# Patient Record
Sex: Male | Born: 1990 | Race: White | Hispanic: No | Marital: Married | Smoking: Never smoker
Health system: Southern US, Community
[De-identification: ages and names within clinical notes are randomized; demographics above are authoritative.]

## PROBLEM LIST (undated history)

## (undated) DIAGNOSIS — B019 Varicella without complication: Secondary | ICD-10-CM

## (undated) DIAGNOSIS — I10 Essential (primary) hypertension: Secondary | ICD-10-CM

## (undated) HISTORY — DX: Varicella without complication: B01.9

## (undated) HISTORY — DX: Essential (primary) hypertension: I10

---
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
## 2013-01-05 IMAGING — CT CT CERVICAL SPINE WITHOUT CONTRAST
1 series · 12 of 14 positions shown, 15 images · non-contrast
Comparison: None

REASON FOR EXAM: fell
COMMENTS:

PROCEDURE:     CT  - CT CERVICAL SPINE WO  - June 25, 2010  [DATE]
RESULT:     Clinical Indication: Trauma
TECHNIQUE: Multiple axial CT images from the skull base to the mid vertebral
body of T1. obtained with sagittal and coronal reformatted images provided.

[Series 5: axial · axial · 0.33mm/px · z∈[+243,+390]mm · 12 of 88 slices shown, 15 images]
[im 7/88  soft-tissue]
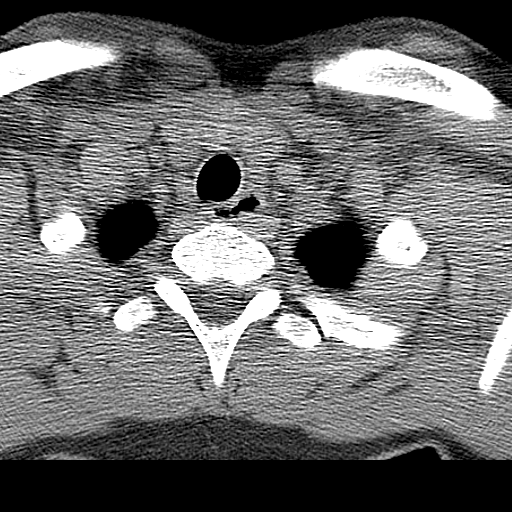
[im 7/88  bone]
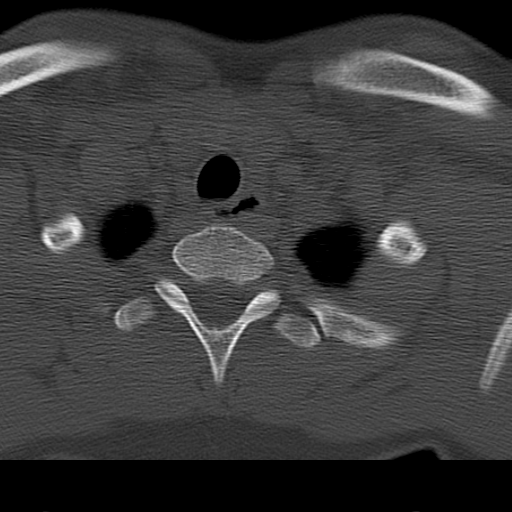
[im 14/88  bone]
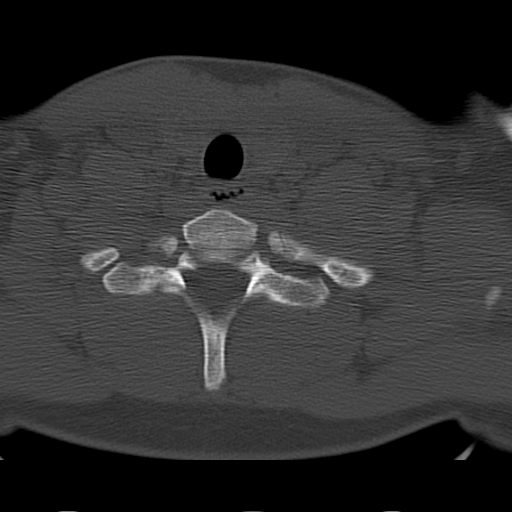
[im 21/88  bone]
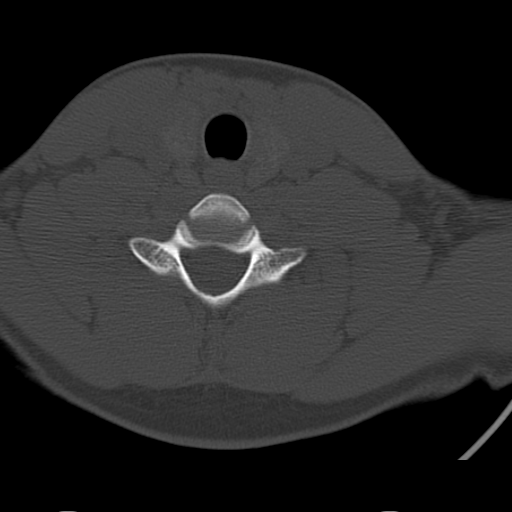
[im 27/88  bone]
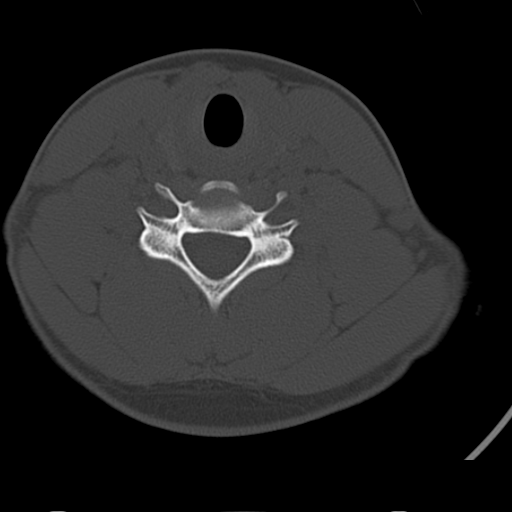
[im 34/88  soft-tissue]
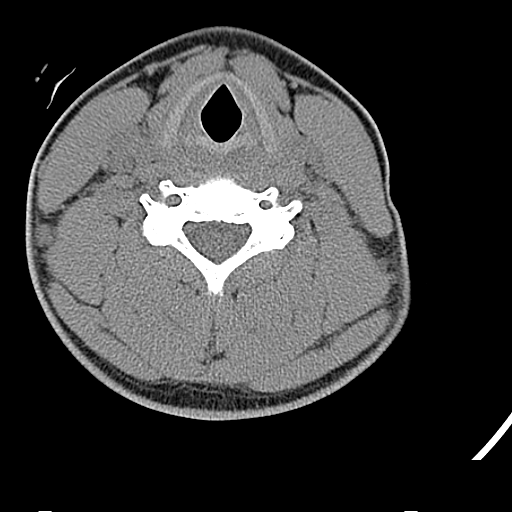
[im 34/88  bone]
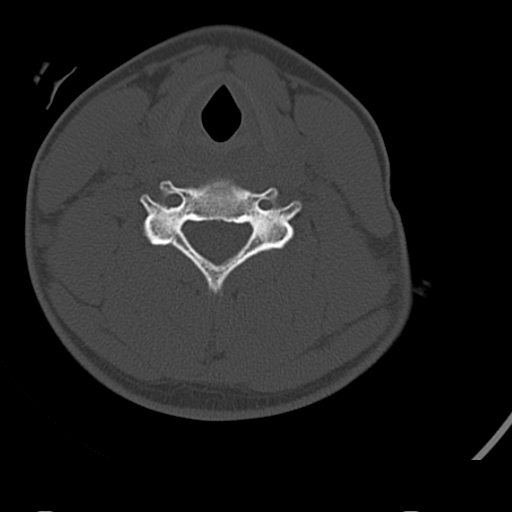
[im 41/88  bone]
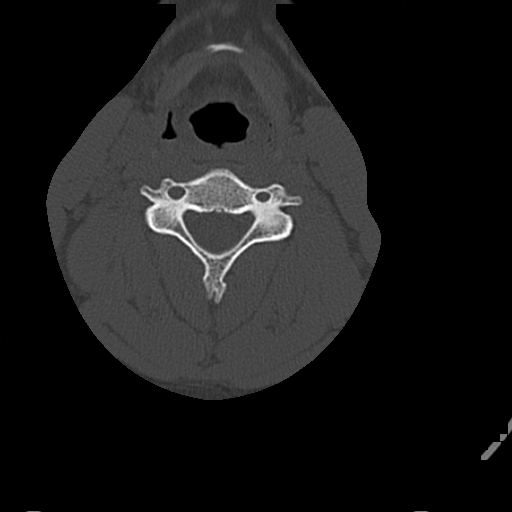
[im 47/88  bone]
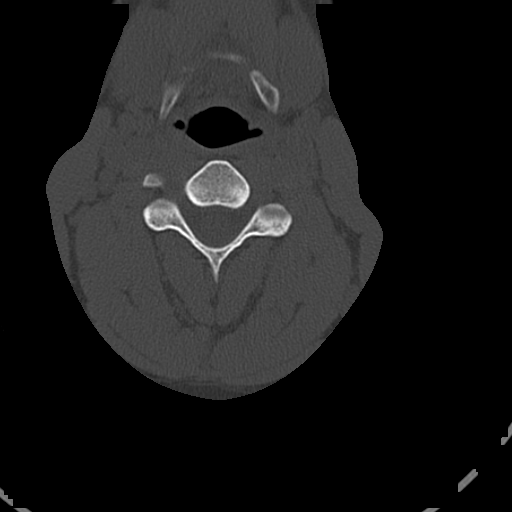
[im 54/88  bone]
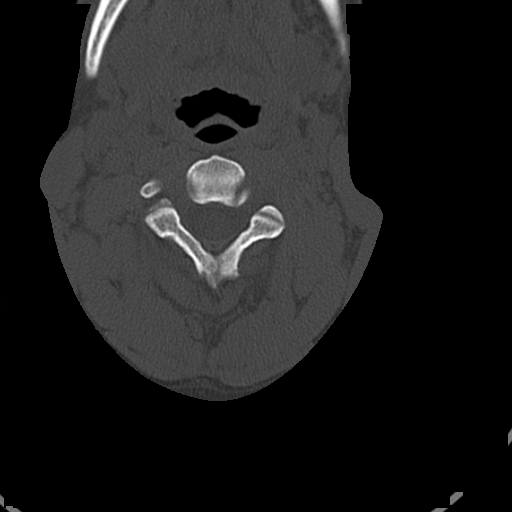
[im 61/88  soft-tissue]
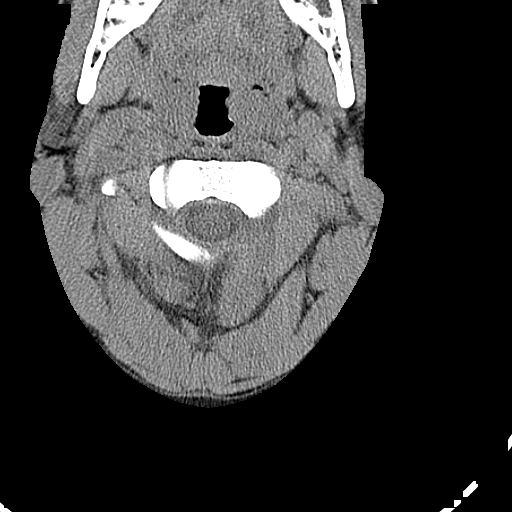
[im 61/88  bone]
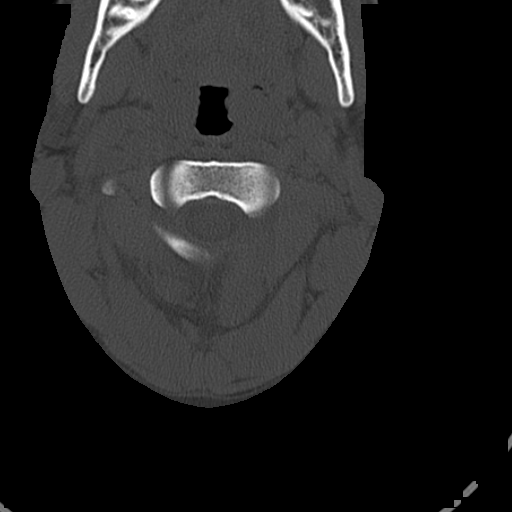
[im 67/88  bone]
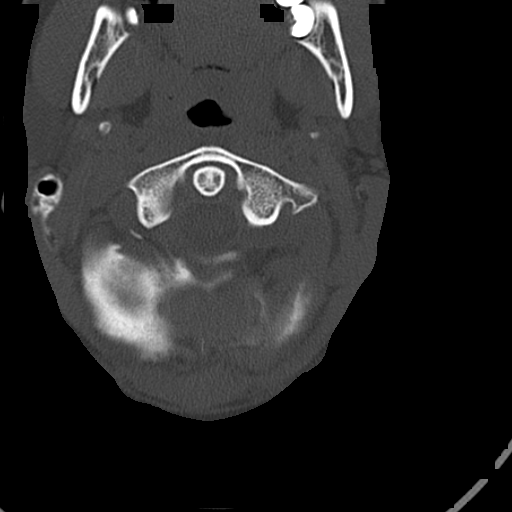
[im 74/88  bone]
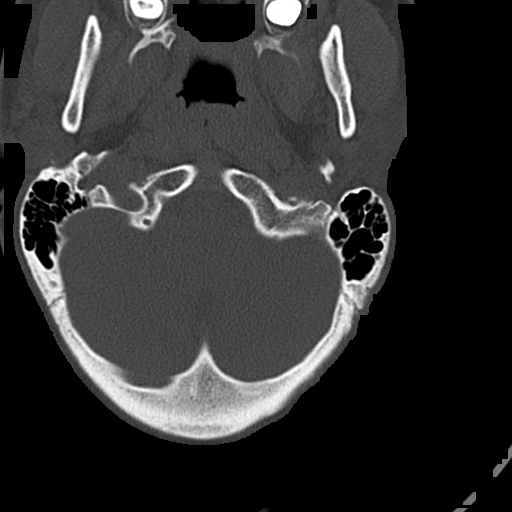
[im 81/88  bone]
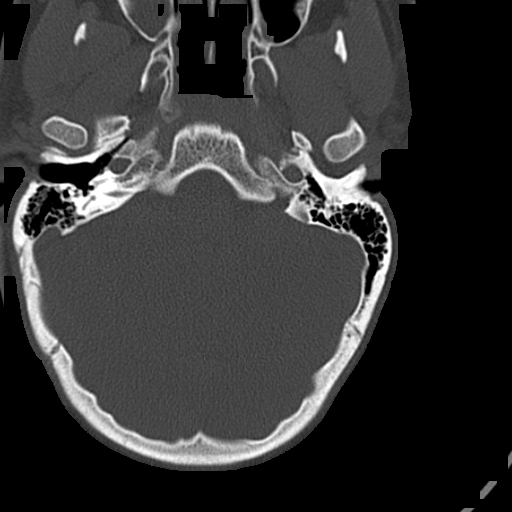

[12 of 14 positions shown; findings below may reference images not displayed]

FINDINGS: The alignment is anatomic. The vertebral body heights are maintained. There
is no acute fracture or static listhesis. The prevertebral soft tissues are
normal. The intraspinal soft tissues are not fully imaged on this
examination due to poor soft tissue contrast, but there is no soft tissue
gross abnormality.

The disc spaces are maintained.

The visualized portions of the lung apices demonstrate no focal abnormality.
IMPRESSION: 1. No acute osseous injury of the cervical spine.

2. Ligamentous injury is not evaluated. If there is high clinical concern
for ligamentous injury, consider MRI or flexion/extension radiographs as
clinically indicated and tolerated.

## 2015-06-04 DIAGNOSIS — J039 Acute tonsillitis, unspecified: Secondary | ICD-10-CM | POA: Diagnosis not present

## 2017-01-13 ENCOUNTER — Emergency Department
Admission: EM | Admit: 2017-01-13 | Discharge: 2017-01-13 | Disposition: A | Discharge status: Home / Self Care | Payer: 59 | Attending: Emergency Medicine | Admitting: Emergency Medicine

## 2017-01-13 DIAGNOSIS — T783XXA Angioneurotic edema, initial encounter: Secondary | ICD-10-CM | POA: Diagnosis not present

## 2017-01-13 DIAGNOSIS — R220 Localized swelling, mass and lump, head: Secondary | ICD-10-CM | POA: Diagnosis present

## 2017-01-13 DIAGNOSIS — T782XXA Anaphylactic shock, unspecified, initial encounter: Secondary | ICD-10-CM | POA: Diagnosis not present

## 2017-01-13 MED ORDER — EPINEPHRINE 0.3 MG/0.3ML IJ SOAJ
0.3000 mg | Freq: Once | INTRAMUSCULAR | Status: AC
  Administered 2017-01-13: 0.3 mg via INTRAMUSCULAR
  Filled 2017-01-13: qty 0.3

## 2017-01-13 NOTE — ED Triage Notes (Signed)
Patient has visible swelling to upper lip. Pt reports similar allergic reactions in past. Pt reports throat soreness, however believes it is due to playing football yesterday.

## 2017-01-13 NOTE — ED Provider Notes (Signed)
[facility] Emergency Department Provider Note  ____________________________________________   First MD Initiated Contact with Patient 01/13/17 2034     (approximate)  I have reviewed the triage vital signs and the nursing notes.   HISTORY  Chief Complaint Facial Swelling   HPI Luis Lewis is a 26 y.o. male who self presents to the emergency department with swelling of his right upper lip that beganearlier today. She feels like the swelling initially improved and then has subsequently worsened. He has no change in his voice. No drooling. He is able to eat and drink without difficulty. He has no itching. No rash. No abdominal pain nausea or vomiting. He never had any episodes like this before until roughly 4 months ago and this is the fourth time this has happened since. He has not seen a doctor. Previously he thought it was an allergic reaction so he took Benadryl which seemed to help however today it did not help. He has no family history of the same. He does not take any Ace inhibitors. He does have a family history of hypertension. He lives at home with his wife.   History reviewed. No pertinent past medical history.  There are no active problems to display for this patient.   History reviewed. No pertinent surgical history.  Prior to Admission medications   Not on File    Allergies Patient has no known allergies.  No family history on file.  Social History Social History  Substance Use Topics  . Smoking status: Never Smoker  . Smokeless tobacco: Never Used  . Alcohol use Yes     Comment: occassional    Review of Systems Constitutional: No fever/chills Eyes: No visual changes. ENT: No sore throat. Cardiovascular: Denies chest pain. Respiratory: Denies shortness of breath. Gastrointestinal: No abdominal pain.  No nausea, no vomiting.  No diarrhea.  No constipation. Genitourinary: Negative for dysuria. Musculoskeletal: Negative for  back pain. Skin: Negative for rash. Neurological: Negative for headaches, focal weakness or numbness.   ____________________________________________   PHYSICAL EXAM:  VITAL SIGNS: ED Triage Vitals  Enc Vitals Group     BP 01/13/17 2027 (!) 167/88     Pulse Rate 01/13/17 2027 84     Resp --      Temp 01/13/17 2027 98.1 F (36.7 C)     Temp Source 01/13/17 2027 Oral     SpO2 01/13/17 2027 100 %     Weight 01/13/17 2027 200 lb (90.7 kg)     Height 01/13/17 2027  (1.727 m)     Head Circumference --      Peak Flow --      Pain Score 01/13/17 2026 2     Pain Loc --      Pain Edu? --      Excl. in GC? --     Constitutional: Alert and oriented 4 pleasant cooperative speaks in full clear sentences no diaphoresis Eyes: PERRL EOMI. Head: Atraumatic. Nose: No congestion/rhinnorhea. Mouth/Throat: Angioedema to the right side of his upper lip No trismus uvula midline normal tongue no posterior involvement Neck: No stridor.   Cardiovascular: Normal rate, regular rhythm. Grossly normal heart sounds.  Good peripheral circulation. Respiratory: Normal respiratory effort.  No retractions. Lungs CTAB and moving good air Gastrointestinal: Soft nontender Musculoskeletal: No lower extremity edema   Neurologic:  Normal speech and language. No gross focal neurologic deficits are appreciated. Skin:  Skin is warm, dry and intact. No rash noted. Psychiatric: Mood and  affect are normal. Speech and behavior are normal.    ____________________________________________   DIFFERENTIAL includes but not limited to  Angioedema, anaphylaxis ____________________________________________   LABS (all labs ordered are listed, but only abnormal results are displayed)  Labs Reviewed - No data to display   __________________________________________  EKG   ____________________________________________  RADIOLOGY   ____________________________________________   PROCEDURES  Procedure(s)  performed: no  Procedures  Critical Care performed: no  Observation: yes  ----------------------------------------- 8:48 PM on 01/13/2017 -----------------------------------------   OBSERVATION CARE: This patient is being placed under observation care for the following reasons: the patient has angioedema of his upper lip requiring 4 hours of observation to evaluate if there is progression of the disease to see if he will require airway support     ____________________________________________   INITIAL IMPRESSION / ASSESSMENT AND PLAN / ED COURSE  Pertinent labs & imaging results that were available during my care of the patient were reviewed by me and considered in my medical decision making (see chart for details).  The patient arrives with clear angioedema that does not appear to be anaphylaxis. He is joking and laughing and has no posterior involvement. He has no family history. We will continue to monitor.    ----------------------------------------- 09:31 PM on 01/13/2017 ----------------------------------------- The patient notes no change after epinephrine. Doubt allergic reaction and this is consistent with angioedema. We will continue to monitor.   ----------------------------------------- 10:10 PM on 01/13/2017 -----------------------------------------   END OF OBSERVATION STATUS: After an appropriate period of observation, this patient is being discharged due to the following reason(s):  He has been observed multiple hours in the emergency department with no change in his symptoms. He has no shortness of breath. He is able to drink without difficulty. He is going home with his wife and his mother who is a Designer, jewellery. I offered the patient further observation however he declined stating to prefer to go home and get rest. I've referred him to an allergist as an outpatient. He is discharged home in improved  condition.  ____________________________________________   FINAL CLINICAL IMPRESSION(S) / ED DIAGNOSES  Final diagnoses:  Angioedema, initial encounter      NEW MEDICATIONS STARTED DURING THIS VISIT:  There are no discharge medications for this patient.    Note:  This document was prepared using Dragon voice recognition software and may include unintentional dictation errors.     Merrily Brittle, MD 01/13/17 2332

## 2017-01-13 NOTE — Discharge Instructions (Signed)
Please make an appointment to follow-up with allergy specialist this week for reevaluation.  Return to the emergency department sooner for any concerns such as shortness of breath, drooling, if your tongue becomes swollen, or for any other issues whatsoever.  It was a pleasure to take care of you today, and thank you for coming to our emergency department.  If you have any questions or concerns before leaving please ask the nurse to grab me and I'm more than happy to go through your aftercare instructions again.  If you were prescribed any opioid pain medication today such as Norco, Vicodin, Percocet, morphine, hydrocodone, or oxycodone please make sure you do not drive when you are taking this medication as it can alter your ability to drive safely.  If you have any concerns once you are home that you are not improving or are in fact getting worse before you can make it to your follow-up appointment, please do not hesitate to call 911 and come back for further evaluation.  Merrily Brittle, MD

## 2017-02-11 DIAGNOSIS — T783XXA Angioneurotic edema, initial encounter: Secondary | ICD-10-CM | POA: Diagnosis not present

## 2017-03-11 DIAGNOSIS — T783XXS Angioneurotic edema, sequela: Secondary | ICD-10-CM | POA: Diagnosis not present

## 2017-03-11 DIAGNOSIS — R202 Paresthesia of skin: Secondary | ICD-10-CM | POA: Diagnosis not present

## 2017-12-09 ENCOUNTER — Ambulatory Visit (INDEPENDENT_AMBULATORY_CARE_PROVIDER_SITE_OTHER): Payer: 59 | Admitting: Family Medicine

## 2017-12-09 ENCOUNTER — Encounter: Payer: Self-pay | Admitting: Family Medicine

## 2017-12-09 VITALS — BP 124/78 | HR 80 | Temp 98.3°F | Ht 67.0 in | Wt 201.0 lb

## 2017-12-09 DIAGNOSIS — B078 Other viral warts: Secondary | ICD-10-CM | POA: Diagnosis not present

## 2017-12-09 DIAGNOSIS — Z131 Encounter for screening for diabetes mellitus: Secondary | ICD-10-CM

## 2017-12-09 DIAGNOSIS — Z0000 Encounter for general adult medical examination without abnormal findings: Secondary | ICD-10-CM | POA: Diagnosis not present

## 2017-12-09 LAB — POCT GLYCOSYLATED HEMOGLOBIN (HGB A1C): HEMOGLOBIN A1C: 5.2 % (ref 4.0–5.6)

## 2017-12-09 NOTE — Addendum Note (Signed)
Addended by: Carola RhineKIGOTHO, Dale Strausser N on: 12/09/2017 10:13 AM   Modules accepted: Orders

## 2017-12-09 NOTE — Patient Instructions (Addendum)
Preventive Care 18-39 Years, Male Preventive care refers to lifestyle choices and visits with your health care provider that can promote health and wellness. What does preventive care include?  A yearly physical exam. This is also called an annual well check.  Dental exams once or twice a year.  Routine eye exams. Ask your health care provider how often you should have your eyes checked.  Personal lifestyle choices, including: ? Daily care of your teeth and gums. ? Regular physical activity. ? Eating a healthy diet. ? Avoiding tobacco and drug use. ? Limiting alcohol use. ? Practicing safe sex. What happens during an annual well check? The services and screenings done by your health care provider during your annual well check will depend on your age, overall health, lifestyle risk factors, and family history of disease. Counseling Your health care provider may ask you questions about your:  Alcohol use.  Tobacco use.  Drug use.  Emotional well-being.  Home and relationship well-being.  Sexual activity.  Eating habits.  Work and work Statistician.  Screening You may have the following tests or measurements:  Height, weight, and BMI.  Blood pressure.  Lipid and cholesterol levels. These may be checked every 5 years starting at age 27.  Diabetes screening. This is done by checking your blood sugar (glucose) after you have not eaten for a while (fasting).  Skin check.  Hepatitis C blood test.  Hepatitis B blood test.  Sexually transmitted disease (STD) testing.  Discuss your test results, treatment options, and if necessary, the need for more tests with your health care provider. Vaccines Your health care provider may recommend certain vaccines, such as:  Influenza vaccine. This is recommended every year.  Tetanus, diphtheria, and acellular pertussis (Tdap, Td) vaccine. You may need a Td booster every 10 years.  Varicella vaccine. You may need this if you  have not been vaccinated.  HPV vaccine. If you are 55 or younger, you may need three doses over 6 months.  Measles, mumps, and rubella (MMR) vaccine. You may need at least one dose of MMR.You may also need a second dose.  Pneumococcal 13-valent conjugate (PCV13) vaccine. You may need this if you have certain conditions and have not been vaccinated.  Pneumococcal polysaccharide (PPSV23) vaccine. You may need one or two doses if you smoke cigarettes or if you have certain conditions.  Meningococcal vaccine. One dose is recommended if you are age 78-21 years and a first-year college student living in a residence hall, or if you have one of several medical conditions. You may also need additional booster doses.  Hepatitis A vaccine. You may need this if you have certain conditions or if you travel or work in places where you may be exposed to hepatitis A.  Hepatitis B vaccine. You may need this if you have certain conditions or if you travel or work in places where you may be exposed to hepatitis B.  Haemophilus influenzae type b (Hib) vaccine. You may need this if you have certain risk factors.  Talk to your health care provider about which screenings and vaccines you need and how often you need them. This information is not intended to replace advice given to you by your health care provider. Make sure you discuss any questions you have with your health care provider. Document Released: 06/05/2001 Document Revised: 12/28/2015 Document Reviewed: 02/08/2015 Elsevier Interactive Patient Education  2018 [place]  Warts Warts are small growths on the skin. They are common and can occur  on various areas of the body. A person may have one wart or multiple warts. Most warts are not painful, and they usually do not cause problems. However, warts can cause pain if they are large or occur in an area of the body where pressure will be applied to them, such as the bottom of the foot. In many cases,  warts do not require treatment. They usually go away on their own over a period of many months to a couple years. Various treatments may be done for warts that cause problems or do not go away. Sometimes, warts go away and then come back again. What are the causes? Warts are caused by a type of virus that is called human papillomavirus (HPV). This virus can spread from person to person through direct contact. Warts can also spread to other areas of the body when a person scratches a wart and then scratches another area of his or her body. What increases the risk? Warts are more likely to develop in:  People who are 65-39 years of age.  People who have a weakened body defense system (immune system).  What are the signs or symptoms? A wart may be round or oval or have an irregular shape. Most warts have a rough surface. Warts may range in color from skin color to light yellow, brown, or gray. They are generally less than  inch (1.3 cm) in size. Most warts are painless, but some can be painful when pressure is applied to them. How is this diagnosed? A wart can usually be diagnosed from its appearance. In some cases, a tissue sample may be removed (biopsy) to be looked at under a microscope. How is this treated? In many cases, warts do not need treatment. If treatment is needed, options may include:  Applying medicated solutions, creams, or patches to the wart. These may be over-the-counter or prescription medicines that make the skin soft so that layers will gradually shed away. In many cases, the medicine is applied one or two times per day and covered with a bandage.  Putting duct tape over the top of the wart (occlusion). You will leave the tape in place for as long as told by your health care provider, then you will replace it with a new strip of tape. This is done until the wart goes away.  Freezing the wart with liquid nitrogen (cryotherapy).  Burning the wart with: ? Laser  treatment. ? An electrified probe (electrocautery).  Injection of a medicine (Candida antigen) into the wart to help the body's immune system to fight off the wart.  Surgery to remove the wart.  Follow these instructions at home:  Apply over-the-counter and prescription medicines only as told by your health care provider.  Do not apply over-the-counter wart medicines to your face or genitals before you ask your health care provider if it is okay to do so.  Do not scratch or pick at a wart.  Wash your hands after you touch a wart.  Avoid shaving hair that is over a wart.  Keep all follow-up visits as told by your health care provider. This is important. Contact a health care provider if:  Your warts do not improve after treatment.  You have redness, swelling, or pain at the site of a wart.  You have bleeding from a wart that does not stop with light pressure.  You have diabetes and you develop a wart. This information is not intended to replace advice given to you by  your health care provider. Make sure you discuss any questions you have with your health care provider. Document Released: 01/17/2005 Document Revised: 09/21/2015 Document Reviewed: 07/05/2014 Elsevier Interactive Patient Education  [employer].

## 2017-12-09 NOTE — Progress Notes (Signed)
Subjective:  Patient accompanied by his wife.   Luis Lewis is a 27 y.o. male and is here for a comprehensive physical exam. The patient reports no problems.  Pt has not been to the doctor since high school.  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  Pt states his face became swollen after cutting himself shaving.  Allergies: Sulfa drugs.  Cause rash as an infant  Social history: Patient is married.  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  Patient endorses social alcohol use.  Patient denies tobacco or drug use.  Social History   Socioeconomic History  . Marital status: Married    Spouse name: Not on file  . Number of children: Not on file  . Years of education: Not on file  . Highest education level: Not on file  Occupational History  . Not on file  Social Needs  . Financial resource strain: Not on file  . Food insecurity:    Worry: Not on file    Inability: Not on file  . Transportation needs:    Medical: Not on file    Non-medical: Not on file  Tobacco Use  . Smoking status: Never Smoker  . Smokeless tobacco: Never Used  Substance and Sexual Activity  . Alcohol use: Yes    Comment: occassional  . Drug use: No  . Sexual activity: Not on file  Lifestyle  . Physical activity:    Days per week: Not on file    Minutes per session: Not on file  . Stress: Not on file  Relationships  . Social connections:    Talks on phone: Not on file    Gets together: Not on file    Attends religious service: Not on file    Active member of club or organization: Not on file    Attends meetings of clubs or organizations: Not on file    Relationship status: Not on file  . Intimate partner violence:    Fear of current or ex partner: Not on file    Emotionally abused: Not on file    Physically abused: Not on file    Forced sexual activity: Not on file  Other Topics Concern  . Not on file  Social History  Narrative  . Not on file   Health Maintenance  Topic Date Due  . HIV Screening  03/02/2008  . TETANUS/TDAP  03/02/2012  . INFLUENZA VACCINE  11/21/2017    The following portions of the patient's history were reviewed and updated as appropriate: allergies, current medications, past family history, past medical history, past social history, past surgical history and problem list.  Review of Systems A comprehensive review of systems was negative.   Objective:    BP 124/78 (BP Location: Right Leg, Patient Position: Sitting, Cuff Size: Large)   Pulse 80   Temp 98.3 F (36.8 C) (Oral)   Ht 5\' 7"  (1.702 m)   Wt 201 lb (91.2 kg)   SpO2 98%   BMI 31.48 kg/m  General appearance: alert, cooperative and no distress Head: Normocephalic, without obvious abnormality, atraumatic Eyes: conjunctivae/corneas clear. PERRL, EOM's intact. Fundi benign. Ears: normal TM's and external ear canals both ears Nose: Nares normal. Septum midline. Mucosa normal. No drainage or sinus tenderness. Throat: lips, mucosa, and tongue normal; teeth and gums normal Neck: no adenopathy, no JVD, supple, symmetrical, trachea midline and thyroid not enlarged, symmetric, no tenderness/mass/nodules Lungs: clear to auscultation bilaterally Heart: regular  rate and rhythm, S1, S2 normal, no murmur, click, rub or gallop Abdomen: soft, non-tender; bowel sounds normal; no masses,  no organomegaly Extremities: extremities normal, atraumatic, no cyanosis or edema Skin: Skin color, texture, turgor normal. No rashes or lesions or wart on dorsum of R hand near wrist Neurologic: Alert and oriented X 3, normal strength and tone. Normal symmetric reflexes. Normal coordination and gait    Assessment:    Healthy male exam.      Plan:     Anticipatory guidance given including wearing seatbelts, smoke detectors in the home, increasing physical activity, increasing p.o. intake of water and vegetables. -will wait on labs -Influenza  vaccine recommended when available -Given handout See After Visit Summary for Counseling Recommendations   Wart -Discussed various treatment options including cryotherapy.  Patient wishes to wait  Follow-up PRN  Abbe AmsterdamShannon Pantera Winterrowd, MD

## 2018-12-15 ENCOUNTER — Encounter: Payer: 59 | Admitting: Family Medicine

## 2019-01-07 ENCOUNTER — Encounter: Payer: Self-pay | Admitting: Family Medicine

## 2019-01-07 ENCOUNTER — Other Ambulatory Visit: Payer: Self-pay

## 2019-01-07 ENCOUNTER — Ambulatory Visit (INDEPENDENT_AMBULATORY_CARE_PROVIDER_SITE_OTHER): Payer: No Typology Code available for payment source | Admitting: Family Medicine

## 2019-01-07 VITALS — BP 110/78 | HR 80 | Temp 96.0°F | Wt 197.0 lb

## 2019-01-07 DIAGNOSIS — Z13220 Encounter for screening for lipoid disorders: Secondary | ICD-10-CM | POA: Diagnosis not present

## 2019-01-07 DIAGNOSIS — Z0000 Encounter for general adult medical examination without abnormal findings: Secondary | ICD-10-CM | POA: Diagnosis not present

## 2019-01-07 LAB — CBC WITH DIFFERENTIAL/PLATELET
Basophils Absolute: 0 10*3/uL (ref 0.0–0.1)
Basophils Relative: 0.6 % (ref 0.0–3.0)
Eosinophils Absolute: 0.1 10*3/uL (ref 0.0–0.7)
Eosinophils Relative: 1.3 % (ref 0.0–5.0)
HCT: 44.1 % (ref 39.0–52.0)
Hemoglobin: 15.2 g/dL (ref 13.0–17.0)
Lymphocytes Relative: 29.5 % (ref 12.0–46.0)
Lymphs Abs: 1.8 10*3/uL (ref 0.7–4.0)
MCHC: 34.4 g/dL (ref 30.0–36.0)
MCV: 84.6 fl (ref 78.0–100.0)
Monocytes Absolute: 0.6 10*3/uL (ref 0.1–1.0)
Monocytes Relative: 10 % (ref 3.0–12.0)
Neutro Abs: 3.5 10*3/uL (ref 1.4–7.7)
Neutrophils Relative %: 58.6 % (ref 43.0–77.0)
Platelets: 213 10*3/uL (ref 150.0–400.0)
RBC: 5.21 Mil/uL (ref 4.22–5.81)
RDW: 12.8 % (ref 11.5–15.5)
WBC: 5.9 10*3/uL (ref 4.0–10.5)

## 2019-01-07 LAB — LIPID PANEL
Cholesterol: 204 mg/dL — ABNORMAL HIGH (ref 0–200)
HDL: 36 mg/dL — ABNORMAL LOW (ref >39.00)
LDL Cholesterol: 145 mg/dL — ABNORMAL HIGH (ref 0–99)
NonHDL: 167.56
Total CHOL/HDL Ratio: 6
Triglycerides: 112 mg/dL (ref 0.0–149.0)
VLDL: 22.4 mg/dL (ref 0.0–40.0)

## 2019-01-07 LAB — BASIC METABOLIC PANEL
BUN: 10 mg/dL (ref 6–23)
CO2: 27 mEq/L (ref 19–32)
Calcium: 10.5 mg/dL (ref 8.4–10.5)
Chloride: 103 mEq/L (ref 96–112)
Creatinine, Ser: 1.13 mg/dL (ref 0.40–1.50)
GFR: 78.53 mL/min (ref >60.00)
Glucose, Bld: 81 mg/dL (ref 70–99)
Potassium: 4 mEq/L (ref 3.5–5.1)
Sodium: 140 mEq/L (ref 135–145)

## 2019-01-07 NOTE — Patient Instructions (Signed)
Preventive Care 19-26 Years Old, Male Preventive care refers to lifestyle choices and visits with your health care provider that can promote health and wellness. This includes:  A yearly physical exam. This is also called an annual well check.  Regular dental and eye exams.  Immunizations.  Screening for certain conditions.  Healthy lifestyle choices, such as eating a healthy diet, getting regular exercise, not using drugs or products that contain nicotine and tobacco, and limiting alcohol use. What can I expect for my preventive care visit? Physical exam Your health care provider will check:  Height and weight. These may be used to calculate body mass index (BMI), which is a measurement that tells if you are at a healthy weight.  Heart rate and blood pressure.  Your skin for abnormal spots. Counseling Your health care provider may ask you questions about:  Alcohol, tobacco, and drug use.  Emotional well-being.  Home and relationship well-being.  Sexual activity.  Eating habits.  Work and work Statistician. What immunizations do I need?  Influenza (flu) vaccine  This is recommended every year. Tetanus, diphtheria, and pertussis (Tdap) vaccine  You may need a Td booster every 10 years. Varicella (chickenpox) vaccine  You may need this vaccine if you have not already been vaccinated. Human papillomavirus (HPV) vaccine  If recommended by your health care provider, you may need three doses over 6 months. Measles, mumps, and rubella (MMR) vaccine  You may need at least one dose of MMR. You may also need a second dose. Meningococcal conjugate (MenACWY) vaccine  One dose is recommended if you are 45-76 years old and a Market researcher living in a residence hall, or if you have one of several medical conditions. You may also need additional booster doses. Pneumococcal conjugate (PCV13) vaccine  You may need this if you have certain conditions and were not  previously vaccinated. Pneumococcal polysaccharide (PPSV23) vaccine  You may need one or two doses if you smoke cigarettes or if you have certain conditions. Hepatitis A vaccine  You may need this if you have certain conditions or if you travel or work in places where you may be exposed to hepatitis A. Hepatitis B vaccine  You may need this if you have certain conditions or if you travel or work in places where you may be exposed to hepatitis B. Haemophilus influenzae type b (Hib) vaccine  You may need this if you have certain risk factors. You may receive vaccines as individual doses or as more than one vaccine together in one shot (combination vaccines). Talk with your health care provider about the risks and benefits of combination vaccines. What tests do I need? Blood tests  Lipid and cholesterol levels. These may be checked every 5 years starting at age 17.  Hepatitis C test.  Hepatitis B test. Screening   Diabetes screening. This is done by checking your blood sugar (glucose) after you have not eaten for a while (fasting).  Sexually transmitted disease (STD) testing. Talk with your health care provider about your test results, treatment options, and if necessary, the need for more tests. Follow these instructions at home: Eating and drinking   Eat a diet that includes fresh fruits and vegetables, whole grains, lean protein, and low-fat dairy products.  Take vitamin and mineral supplements as recommended by your health care provider.  Do not drink alcohol if your health care provider tells you not to drink.  If you drink alcohol: ? Limit how much you have to 0-2  drinks a day. ? Be aware of how much alcohol is in your drink. In the [place] one drink equals one 12 oz bottle of beer (355 mL), one 5 oz glass of wine (148 mL), or one 1 oz glass of hard liquor (44 mL). Lifestyle  Take daily care of your teeth and gums.  Stay active. Exercise for at least 30 minutes on 5 or  more days each week.  Do not use any products that contain nicotine or tobacco, such as cigarettes, e-cigarettes, and chewing tobacco. If you need help quitting, ask your health care provider.  If you are sexually active, practice safe sex. Use a condom or other form of protection to prevent STIs (sexually transmitted infections). What's next?  Go to your health care provider once a year for a well check visit.  Ask your health care provider how often you should have your eyes and teeth checked.  Stay up to date on all vaccines. This information is not intended to replace advice given to you by your health care provider. Make sure you discuss any questions you have with your health care provider. Document Released: 06/05/2001 Document Revised: 04/03/2018 Document Reviewed: 04/03/2018 Elsevier Patient Education  2020 Elsevier Inc.  

## 2019-01-07 NOTE — Progress Notes (Signed)
Subjective:     [name] is a 28 y.o. male and is here for a comprehensive physical exam. The patient reports no problems.  Pt started running and doing more cardio.  Notes several exercise related injuries.  Went to PT for L hip.  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 Pt notes improvement in hip since he restarted running.  In an attempt to overcome his fear of needles, pt gave plasma several months ago.  He also found out he has O negative blood. Social History   Socioeconomic History  . Marital status: Married    Spouse name: Not on file  . Number of children: Not on file  . Years of education: Not on file  . Highest education level: Not on file  Occupational History  . Not on file  Social Needs  . Financial resource strain: Not on file  . Food insecurity    Worry: Not on file    Inability: Not on file  . Transportation needs    Medical: Not on file    Non-medical: Not on file  Tobacco Use  . Smoking status: Never Smoker  . Smokeless tobacco: Never Used  Substance and Sexual Activity  . Alcohol use: Yes    Comment: occassional  . Drug use: No  . Sexual activity: Not on file  Lifestyle  . Physical activity    Days per week: Not on file    Minutes per session: Not on file  . Stress: Not on file  Relationships  . Social Musicianconnections    Talks on phone: Not on file    Gets together: Not on file    Attends religious service: Not on file    Active member of club or organization: Not on file    Attends meetings of clubs or organizations: Not on file    Relationship status: Not on file  . Intimate partner violence    Fear of current or ex partner: Not on file    Emotionally abused: Not on file    Physically abused: Not on file    Forced sexual activity: Not on file  Other Topics Concern  . Not on file  Social History Narrative  . Not on file   Health Maintenance  Topic Date Due  . HIV Screening  03/02/2008  . TETANUS/TDAP  03/02/2012  . INFLUENZA VACCINE   11/22/2018    The following portions of the patient's history were reviewed and updated as appropriate: allergies, current medications, past family history, past medical history, past social history, past surgical history and problem list.  Review of Systems Pertinent items noted in HPI and remainder of comprehensive ROS otherwise negative.   Objective:    BP 110/78 (BP Location: Left Arm, Patient Position: Sitting, Cuff Size: Normal)   Pulse 80   Temp (!) 96 F (35.6 C) (Oral)   Wt 197 lb (89.4 kg)   SpO2 97%   BMI 30.85 kg/m  General appearance: alert, cooperative and no distress Head: Normocephalic, without obvious abnormality, atraumatic Eyes: conjunctivae/corneas clear. PERRL, EOM's intact. Fundi benign. Ears: normal TM's and external ear canals both ears Nose: Nares normal. Septum midline. Mucosa normal. No drainage or sinus tenderness. Throat: lips, mucosa, and tongue normal; teeth and gums normal Neck: no adenopathy, no carotid bruit, no JVD, supple, symmetrical, trachea midline and thyroid not enlarged, symmetric, no tenderness/mass/nodules Lungs: clear to auscultation bilaterally Heart: regular rate and rhythm, S1, S2 normal, no murmur, click, rub or gallop Abdomen: soft,  non-tender; bowel sounds normal; no masses,  no organomegaly Extremities: extremities normal, atraumatic, no cyanosis or edema Pulses: 2+ and symmetric Skin: Skin color, texture, turgor normal. No rashes or lesions Lymph nodes: Cervical, supraclavicular, and axillary nodes normal. Neurologic: Alert and oriented X 3, normal strength and tone. Normal symmetric reflexes. Normal coordination and gait    Assessment:    Healthy male exam.      Plan:     Anticipatory guidance given including wearing seatbelts, smoke detectors in the home, increasing physical activity, increasing p.o. intake of water and vegetables. -will obtain labs -offered influenza vaccine.  Pt declines. -given handout -next CPE in  1 yr See After Visit Summary for Counseling Recommendations    F/u prn  [name], MD

## 2019-02-09 ENCOUNTER — Encounter: Payer: Self-pay | Admitting: Family Medicine

## 2019-02-09 ENCOUNTER — Telehealth (INDEPENDENT_AMBULATORY_CARE_PROVIDER_SITE_OTHER): Payer: No Typology Code available for payment source | Admitting: Family Medicine

## 2019-02-09 ENCOUNTER — Other Ambulatory Visit: Payer: Self-pay

## 2019-02-09 DIAGNOSIS — B029 Zoster without complications: Secondary | ICD-10-CM | POA: Diagnosis not present

## 2019-02-09 MED ORDER — GABAPENTIN 100 MG PO CAPS: 100.0000 mg | ORAL_CAPSULE | Freq: Three times a day (TID) | ORAL | 0 refills | Status: DC | PRN

## 2019-02-09 NOTE — Progress Notes (Signed)
Virtual Visit via Video Note Call started via doxy, however pt also called 2/2 poor audio quality.  Video remained on during entire visit. I connected with Luis Lewis on 02/09/19 at 10:00 AM EDT by a video enabled telemedicine application 2/2 VHQIO-96 pandemic and verified that I am speaking with the correct person using two identifiers.  Location patient: home Location provider:work or home office Persons participating in the virtual visit: patient, provider  I discussed the limitations of evaluation and management by telemedicine and the availability of in person appointments. The patient expressed understanding and agreed to proceed.   HPI: Pt felt like he pulled something in his neck/ R shoulder on Wed.  By Thursday he couldn't bend his neck without pain and noticed a rash.  The rash is vesicular and painful.  Pain started before the rash developed.  Pt tried to pop the initial vesicles, which may have caused a bump to develop on his L arm.  Pain has "gone away from the most par".    Pt endorses fever 100.5 F that has since gone down.  Pt had chicken poxs at age 63.  Hearing is normal, but ear feels funny/tingling.  Pt notes increased stress at work- driving to [place].     Due to poor video quality pt advised to take a picture of the rash and send via mychart.  ROS: See pertinent positives and negatives per HPI.  Past Medical History:  Diagnosis Date  . Chicken pox   . Hypertension     No past surgical history on file.  No family history on file.   Current Outpatient Medications:  .  cetirizine (ZYRTEC) 10 MG tablet, Take 10 mg by mouth daily. OTC, Disp: , Rfl:   EXAM:  VITALS per patient if applicable: RR between 29-52 bpm  GENERAL: alert, oriented, appears well and in no acute distress  HEENT: atraumatic, conjunctiva clear, no obvious abnormalities on inspection of external nose and ears  NECK: normal movements of the head and neck  LUNGS: on inspection no signs  of respiratory distress, breathing rate appears normal, no obvious gross SOB, gasping or wheezing  CV: no obvious cyanosis  SKIN:  Vesicular rash with mild surrounding erythema on anterior and posterior R shoulder and R posterior neck.  One vesicle noted on LUE.  Difficult to appreciate rash via pictures sent.          MS: moves all visible extremities without noticeable abnormality  PSYCH/NEURO: pleasant and cooperative, no obvious depression or anxiety, speech and thought processing grossly intact  ASSESSMENT AND PLAN:  Discussed the following assessment and plan:  Herpes zoster without complication -discussed causes, disease course, treatment options. -given duration of symptoms antiviral less likely to be beneficial.  Pt agrees not needed at this time. -discussed gabapentin for neuropathic pain.   -discussed can spread to others if vesicles still present. -given precautions -consider referral to ENT for any changes in hearing or tinnitus.  - Plan: gabapentin (NEURONTIN) 100 MG capsule  F/u prn  I discussed the assessment and treatment plan with the patient. The patient was provided an opportunity to ask questions and all were answered. The patient agreed with the plan and demonstrated an understanding of the instructions.   The patient was advised to call back or seek an in-person evaluation if the symptoms worsen or if the condition fails to improve as anticipated.  I provided 18 minutes of non-face-to-face time during this encounter.   Billie Ruddy, MD

## 2019-02-27 ENCOUNTER — Other Ambulatory Visit: Payer: Self-pay

## 2019-02-27 DIAGNOSIS — Z20822 Contact with and (suspected) exposure to covid-19: Secondary | ICD-10-CM

## 2019-02-28 LAB — NOVEL CORONAVIRUS, NAA: SARS-CoV-2, NAA: DETECTED — AB

## 2021-02-02 ENCOUNTER — Ambulatory Visit (INDEPENDENT_AMBULATORY_CARE_PROVIDER_SITE_OTHER): Payer: Managed Care, Other (non HMO) | Admitting: Family Medicine

## 2021-02-02 ENCOUNTER — Encounter: Payer: Self-pay | Admitting: Family Medicine

## 2021-02-02 ENCOUNTER — Other Ambulatory Visit: Payer: Self-pay

## 2021-02-02 VITALS — BP 138/60 | HR 84 | Temp 98.6°F | Wt 199.8 lb

## 2021-02-02 DIAGNOSIS — R110 Nausea: Secondary | ICD-10-CM

## 2021-02-02 DIAGNOSIS — F439 Reaction to severe stress, unspecified: Secondary | ICD-10-CM | POA: Diagnosis not present

## 2021-02-02 NOTE — Progress Notes (Signed)
Subjective:    Patient ID: Luis Lewis, male    DOB: [birth year], 31 y.o.   MRN: [id number]  Chief Complaint  Patient presents with   Nausea    Off and on for 1 week, some vomiting, abdominal discomfort/pain, thinks this may be stress related.     HPI Patient is a 30 year old male with PMH sig for shingles, seasonal allergies who was seen today for acute concern.  Pt endorses intermittent nausea and abdominal discomfort for the last week or so.  Nausea may last 10 min.  Having increased BMs daily, ~ 4.  Symptoms have improved over the last 2 days.  Noticed symptoms before a family members wedding and at work.  Pt endorses increased stress.  Buying a home, the closing date getting pushed back, but having to be out of the current rental on the initial closing date.  Also under increased stress at work as the company is expanding.  Pt and his wife Dahlia Client have a newborn at home.  Does not have much time to go to the gym which he enjoys.   Past Medical History:  Diagnosis Date   Chicken pox    Hypertension     Allergies  Allergen Reactions   Sulfa Antibiotics     Rash as an infant    ROS General: Denies fever, chills, night sweats, changes in weight, changes in appetite HEENT: Denies headaches, ear pain, changes in vision, rhinorrhea, sore throat CV: Denies CP, palpitations, SOB, orthopnea Pulm: Denies SOB, cough, wheezing GI: Denies abdominal pain, nausea, vomiting, diarrhea, constipation +abd pain, nausea-resolved GU: Denies dysuria, hematuria, frequency Msk: Denies muscle cramps, joint pains Neuro: Denies weakness, numbness, tingling Skin: Denies rashes, bruising Psych: Denies depression, anxiety, hallucinations +increased stress     Objective:    Blood pressure 138/60, pulse 84, temperature 98.6 F (37 C), temperature source Oral, weight 199 lb 12.8 oz (90.6 kg), SpO2 98 %.  Gen. Pleasant, well-nourished, in no distress, normal affect   HEENT: [place]/AT, face symmetric,  conjunctiva clear, no scleral icterus, PERRLA, EOMI, nares patent without drainage Lungs: no accessory muscle use Cardiovascular: RRR, no peripheral edema Musculoskeletal: No deformities, no cyanosis or clubbing, normal tone Neuro:  A&Ox3, CN II-XII intact, normal gait Skin:  Warm, no lesions/ rash   Wt Readings from Last 3 Encounters:  02/02/21 199 lb 12.8 oz (90.6 kg)  01/07/19 197 lb (89.4 kg)  12/09/17 201 lb (91.2 kg)    Lab Results  Component Value Date   WBC 5.9 01/07/2019   HGB 15.2 01/07/2019   HCT 44.1 01/07/2019   PLT 213.0 01/07/2019   GLUCOSE 81 01/07/2019   CHOL 204 (H) 01/07/2019   TRIG 112.0 01/07/2019   HDL 36.00 (L) 01/07/2019   LDLCALC 145 (H) 01/07/2019   NA 140 01/07/2019   K 4.0 01/07/2019   CL 103 01/07/2019   CREATININE 1.13 01/07/2019   BUN 10 01/07/2019   CO2 27 01/07/2019   HGBA1C 5.2 12/09/2017    Assessment/Plan:  Stress -PHQ-9 score 7 -Discussed importance of self-care.  Patient encouraged to find time to exercise weekly.  Also discussed taking breaks at work to get fresh air. -Consider counseling.  Given info on [facility] providers. -Advised medication options available, but not indicated at this time.  Patient would like to avoid medication if possible. -Given handout  Nausea -Likely 2/2 increased stress/anxiety prior to recent events/tasks -Discussed ways to decrease anxiety such as deep breathing and other self-care -Offered medication for nausea.  Patient declines at this time. -Continue to monitor  F/u prn in the next 3-4 wks  Abbe Amsterdam, MD

## 2021-02-02 NOTE — Patient Instructions (Signed)
Maggie Font, [facility] and doing virtual visits [phone]

## 2021-05-01 ENCOUNTER — Encounter: Payer: Managed Care, Other (non HMO) | Admitting: Family Medicine

## 2021-05-12 ENCOUNTER — Encounter: Payer: Self-pay | Admitting: Family Medicine

## 2021-05-12 ENCOUNTER — Ambulatory Visit (INDEPENDENT_AMBULATORY_CARE_PROVIDER_SITE_OTHER): Payer: Managed Care, Other (non HMO) | Admitting: Family Medicine

## 2021-05-12 VITALS — BP 100/68 | HR 80 | Temp 98.0°F | Ht 67.5 in | Wt 191.8 lb

## 2021-05-12 DIAGNOSIS — Z820 Family history of epilepsy and other diseases of the nervous system: Secondary | ICD-10-CM | POA: Diagnosis not present

## 2021-05-12 DIAGNOSIS — B354 Tinea corporis: Secondary | ICD-10-CM

## 2021-05-12 DIAGNOSIS — Z0000 Encounter for general adult medical examination without abnormal findings: Secondary | ICD-10-CM | POA: Diagnosis not present

## 2021-05-12 DIAGNOSIS — E782 Mixed hyperlipidemia: Secondary | ICD-10-CM

## 2021-05-12 DIAGNOSIS — Z1159 Encounter for screening for other viral diseases: Secondary | ICD-10-CM

## 2021-05-12 LAB — CBC WITH DIFFERENTIAL/PLATELET
Basophils Absolute: 0.1 10*3/uL (ref 0.0–0.1)
Basophils Relative: 1.3 % (ref 0.0–3.0)
Eosinophils Absolute: 0.1 10*3/uL (ref 0.0–0.7)
Eosinophils Relative: 2.9 % (ref 0.0–5.0)
HCT: 43.6 % (ref 39.0–52.0)
Hemoglobin: 14.9 g/dL (ref 13.0–17.0)
Lymphocytes Relative: 30.2 % (ref 12.0–46.0)
Lymphs Abs: 1.4 10*3/uL (ref 0.7–4.0)
MCHC: 34.1 g/dL (ref 30.0–36.0)
MCV: 85.7 fl (ref 78.0–100.0)
Monocytes Absolute: 0.6 10*3/uL (ref 0.1–1.0)
Monocytes Relative: 12.3 % — ABNORMAL HIGH (ref 3.0–12.0)
Neutro Abs: 2.4 10*3/uL (ref 1.4–7.7)
Neutrophils Relative %: 53.3 % (ref 43.0–77.0)
Platelets: 221 10*3/uL (ref 150.0–400.0)
RBC: 5.09 Mil/uL (ref 4.22–5.81)
RDW: 12.8 % (ref 11.5–15.5)
WBC: 4.5 10*3/uL (ref 4.0–10.5)

## 2021-05-12 LAB — COMPREHENSIVE METABOLIC PANEL
ALT: 19 U/L (ref 0–53)
AST: 24 U/L (ref 0–37)
Albumin: 4.4 g/dL (ref 3.5–5.2)
Alkaline Phosphatase: 52 U/L (ref 39–117)
BUN: 12 mg/dL (ref 6–23)
CO2: 30 mEq/L (ref 19–32)
Calcium: 9.8 mg/dL (ref 8.4–10.5)
Chloride: 102 mEq/L (ref 96–112)
Creatinine, Ser: 1.24 mg/dL (ref 0.40–1.50)
GFR: 79.3 mL/min (ref >60.00)
Glucose, Bld: 95 mg/dL (ref 70–99)
Potassium: 4.3 mEq/L (ref 3.5–5.1)
Sodium: 139 mEq/L (ref 135–145)
Total Bilirubin: 0.8 mg/dL (ref 0.2–1.2)
Total Protein: 7.1 g/dL (ref 6.0–8.3)

## 2021-05-12 LAB — T4, FREE: Free T4: 0.86 ng/dL (ref 0.60–1.60)

## 2021-05-12 LAB — LIPID PANEL
Cholesterol: 171 mg/dL (ref 0–200)
HDL: 31.9 mg/dL — ABNORMAL LOW (ref >39.00)
LDL Cholesterol: 112 mg/dL — ABNORMAL HIGH (ref 0–99)
NonHDL: 138.81
Total CHOL/HDL Ratio: 5
Triglycerides: 134 mg/dL (ref 0.0–149.0)
VLDL: 26.8 mg/dL (ref 0.0–40.0)

## 2021-05-12 LAB — HEMOGLOBIN A1C: Hgb A1c MFr Bld: 5.4 % (ref 4.6–6.5)

## 2021-05-12 LAB — TSH: TSH: 0.87 u[IU]/mL (ref 0.35–5.50)

## 2021-05-12 NOTE — Progress Notes (Addendum)
Subjective:     Luis Lewis is a 31 y.o. male and is here for a comprehensive physical exam. The patient reports doing well since last OFV.  Pt's daughter is doing well.  Notes less stress after moving.  Working out at a [employer].  Has a pruritic, red area in L axilla.  Started antifungal cream as thought it may be ringworm.  Pt trying to eat healthier.  Has a smoothie in am with kale.  Pt's GF recently died, had a h/o Alzheimer's and bipolar d/o on lithium.  Pt also notes h/o CVD in his GF and GM.    Social History   Socioeconomic History   Marital status: Married    Spouse name: Not on file   Number of children: Not on file   Years of education: Not on file   Highest education level: Not on file  Occupational History   Not on file  Tobacco Use   Smoking status: Never   Smokeless tobacco: Never  Substance and Sexual Activity   Alcohol use: Yes    Comment: occassional   Drug use: No   Sexual activity: Not on file  Other Topics Concern   Not on file  Social History Narrative   Not on file   Social Determinants of Health   Financial Resource Strain: Not on file  Food Insecurity: Not on file  Transportation Needs: Not on file  Physical Activity: Not on file  Stress: Not on file  Social Connections: Not on file  Intimate Partner Violence: Not on file   Health Maintenance  Topic Date Due   COVID-19 Vaccine (1) Never done   HIV Screening  Never done   Hepatitis C Screening  Never done   TETANUS/TDAP  Never done   INFLUENZA VACCINE  Never done   HPV VACCINES  Aged Out    The following portions of the patient's history were reviewed and updated as appropriate: allergies, current medications, past family history, past medical history, past social history, past surgical history, and problem list.  Review of Systems Pertinent items noted in HPI and remainder of comprehensive ROS otherwise negative.   Objective:    BP 100/68 (BP Location: Left Arm, Patient  Position: Sitting, Cuff Size: Large)    Pulse 80    Temp 98 F (36.7 C) (Oral)    Ht 5' 7.5" (1.715 m)    Wt 191 lb 12.8 oz (87 kg)    SpO2 97%    BMI 29.60 kg/m  General appearance: alert, cooperative, and no distress Head: Normocephalic, without obvious abnormality, atraumatic Eyes: conjunctivae/corneas clear. PERRL, EOM's intact. Fundi benign. Ears: normal TM's and external ear canals both ears.  Cerumen in left ear partially occluding TM. Nose: Nares normal. Septum midline. Mucosa normal. No drainage or sinus tenderness. Throat: lips, mucosa, and tongue normal; teeth and gums normal  Chronically enlarged tonsils L>R with crypts visible. Neck: no adenopathy, no carotid bruit, no JVD, supple, symmetrical, trachea midline, and thyroid not enlarged, symmetric, no tenderness/mass/nodules Lungs: clear to auscultation bilaterally Heart: regular rate and rhythm, S1, S2 normal, no murmur, click, rub or gallop Abdomen: soft, non-tender; bowel sounds normal; no masses,  no organomegaly Extremities: extremities normal, atraumatic, no cyanosis or edema Pulses: 2+ and symmetric Skin:  Dry, intact. A 1.5 cm erythematous, scaly, ovoid appearing plaques with rolled edges in L axilla. Lymph nodes: Cervical, supraclavicular, and axillary nodes normal. Neurologic: Alert and oriented X 3, normal strength and tone. Normal symmetric reflexes. Normal  coordination and gait    Assessment:    Healthy male exam with tinea corporis and chronically enlarged tonsils.   Plan:    Anticipatory guidance given including wearing seatbelts, smoke detectors in the home, increasing physical activity, increasing p.o. intake of water and vegetables. -Obtain labs -Immunizations reviewed.  Patient declines at this time. -declines handout -next CPE in 1 yr See After Visit Summary for Counseling Recommendations   Mixed hyperlipidemia -Continue lifestyle modifications  - Plan: Hemoglobin A1c, Lipid panel, CMP  Family  history of Alzheimer's disease  Tinea corporis -OTC antifungal cream  Encounter for hepatitis C screening test for low risk patient  - Plan: Hep C Antibody  F/u  prn  Abbe Amsterdam, MD

## 2021-05-15 LAB — HEPATITIS C ANTIBODY
Hepatitis C Ab: NONREACTIVE
SIGNAL TO CUT-OFF: <0.02 (ref <1.00)

## 2022-02-01 ENCOUNTER — Telehealth: Payer: Self-pay | Admitting: Family Medicine

## 2022-02-01 NOTE — Telephone Encounter (Signed)
Pt wife dropped off Medical Evaluation forms to be signed by Dr. Elyn Peers have been placed in red folder. Pt is aware it will take 5-7 business days.   Please advise.

## 2022-02-01 NOTE — Telephone Encounter (Signed)
Spoke with pt, informed him he would need to be seen before form could be completed. Appointment scheduled.

## 2022-02-09 ENCOUNTER — Ambulatory Visit (INDEPENDENT_AMBULATORY_CARE_PROVIDER_SITE_OTHER): Payer: Managed Care, Other (non HMO) | Admitting: Family Medicine

## 2022-02-09 ENCOUNTER — Encounter: Payer: Self-pay | Admitting: Family Medicine

## 2022-02-09 VITALS — BP 124/82 | HR 77 | Temp 98.8°F | Ht 67.0 in | Wt 191.6 lb

## 2022-02-09 DIAGNOSIS — Z029 Encounter for administrative examinations, unspecified: Secondary | ICD-10-CM | POA: Diagnosis not present

## 2022-02-09 NOTE — Progress Notes (Addendum)
Subjective:    Patient ID: Luis Lewis, male    DOB: 02/01/1991, 31 y.o.   MRN: [id number]  Chief Complaint  Patient presents with   forms    Medical clearance form     HPI Patient was seen today for form completion.  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  Patient denies current illness or issue.  Patient recently returned from a family trip to [place].  Patient's daughter is almost 3 yo.  Past Medical History:  Diagnosis Date   Chicken pox    Hypertension     Allergies  Allergen Reactions   Sulfa Antibiotics     Rash as an infant    ROS General: Denies fever, chills, night sweats, changes in weight, changes in appetite HEENT: Denies headaches, ear pain, changes in vision, rhinorrhea, sore throat CV: Denies CP, palpitations, SOB, orthopnea Pulm: Denies SOB, cough, wheezing GI: Denies abdominal pain, nausea, vomiting, diarrhea, constipation GU: Denies dysuria, hematuria, frequency Msk: Denies muscle cramps, joint pains Neuro: Denies weakness, numbness, tingling Skin: Denies rashes, bruising Psych: Denies depression, anxiety, hallucinations     Objective:    Blood pressure 124/82, pulse 77, temperature 98.8 F (37.1 C), temperature source Oral, height 5\' 7"  (1.702 m), weight 191 lb 9.6 oz (86.9 kg), SpO2 97 %.   Gen. Pleasant, well-nourished, in no distress, normal affect   HEENT: [place]/AT, face symmetric, conjunctiva clear, no scleral icterus, PERRLA, EOMI, nares patent without drainage Lungs: no accessory muscle use Cardiovascular: RRR, no m/r/g, no peripheral edema Neuro:  A&Ox3, CN II-XII intact, normal gait Skin:  Warm, no lesions/ rash   Wt Readings from Last 3 Encounters:  02/09/22 191 lb 9.6 oz (86.9 kg)  05/12/21 191 lb 12.8 oz (87 kg)  02/02/21 199 lb 12.8 oz (90.6 kg)    Lab Results  Component Value Date   WBC 4.5 05/12/2021   HGB 14.9 05/12/2021   HCT 43.6 05/12/2021   PLT 221.0 05/12/2021   GLUCOSE 95 05/12/2021   CHOL 171  05/12/2021   TRIG 134.0 05/12/2021   HDL 31.90 (L) 05/12/2021   LDLCALC 112 (H) 05/12/2021   ALT 19 05/12/2021   AST 24 05/12/2021   NA 139 05/12/2021   K 4.3 05/12/2021   CL 102 05/12/2021   CREATININE 1.24 05/12/2021   BUN 12 05/12/2021   CO2 30 05/12/2021   TSH 0.87 05/12/2021   HGBA1C 5.4 05/12/2021    Assessment/Plan:  Administrative encounter -[name] parent form completed  F/u as needed  [name], MD

## 2022-07-09 ENCOUNTER — Ambulatory Visit: Payer: Managed Care, Other (non HMO) | Admitting: Family Medicine

## 2022-07-09 VITALS — BP 126/68 | HR 75 | Temp 98.3°F | Ht 67.0 in | Wt 187.6 lb

## 2022-07-09 DIAGNOSIS — K529 Noninfective gastroenteritis and colitis, unspecified: Secondary | ICD-10-CM | POA: Diagnosis not present

## 2022-07-09 DIAGNOSIS — R5383 Other fatigue: Secondary | ICD-10-CM | POA: Diagnosis not present

## 2022-07-09 DIAGNOSIS — L409 Psoriasis, unspecified: Secondary | ICD-10-CM

## 2022-07-09 DIAGNOSIS — H6123 Impacted cerumen, bilateral: Secondary | ICD-10-CM

## 2022-07-09 DIAGNOSIS — R197 Diarrhea, unspecified: Secondary | ICD-10-CM

## 2022-07-09 DIAGNOSIS — R112 Nausea with vomiting, unspecified: Secondary | ICD-10-CM

## 2022-07-09 LAB — POC COVID19 BINAXNOW: SARS Coronavirus 2 Ag: NEGATIVE

## 2022-07-09 MED ORDER — ONDANSETRON HCL 4 MG PO TABS: 4.0000 mg | ORAL_TABLET | Freq: Three times a day (TID) | ORAL | 0 refills | Status: AC | PRN

## 2022-07-09 MED ORDER — TRIAMCINOLONE ACETONIDE 0.025 % EX OINT: 1.0000 | TOPICAL_OINTMENT | Freq: Two times a day (BID) | CUTANEOUS | 0 refills | Status: AC

## 2022-07-09 NOTE — Patient Instructions (Addendum)
A prescription for nausea medication called Zofran was sent to her pharmacy as well as a steroid cream called triamcinolone for the patches between your fingers.  Try over-the-counter Imodium for continued diarrhea symptoms.  Hydration will be important.  Taking small sips of Gatorade or other fluids throughout the day.  Consider taking a probiotic to help with diarrhea.  For continued or worsened diarrhea, nausea, and vomiting proceed to nearest ED.

## 2022-07-09 NOTE — Progress Notes (Signed)
Established Patient Office Visit   Subjective  Patient ID: Luis Lewis, male    DOB: 1990-06-08  Age: 32 y.o. MRN: [id number]  Chief Complaint  Patient presents with   Nausea    Pt reports sx of nausea, vomitting and diarrhea. Started Thursday. And added that has two toddlers at home sick.    Diarrhea    Pt is a 32 yo male with no sig pmh.  Pt endorses diarrhea, fatigue, nausea, and vomiting that started 4-5 days ago.  Endorses 20 loose stools some days.  Pt notes his whole family has been sick off and on with similar symptoms x 1 month.  Pt does not recall eating any new foods, travel, etc.  Pt's family has chickens that are in the house due to the weather. The chickens are in a contained area and the kids have not been around them.  Pt tried Kaopectate, gastrox?.  Pt concerned about dehydration as he had little to no UOP for 2 days.  Symptoms slowing down today.  Diarrhea       Review of Systems  Gastrointestinal:  Positive for diarrhea.   Negative unless stated above    Objective:     BP 126/68 (BP Location: Right Arm, Patient Position: Sitting, Cuff Size: Large)   Pulse 75   Temp 98.3 F (36.8 C) (Oral)   Ht 5\' 7"  (1.702 m)   Wt 187 lb 9.6 oz (85.1 kg)   SpO2 99%   BMI 29.38 kg/m    Physical Exam Constitutional:      General: He is not in acute distress.    Appearance: Normal appearance.  HENT:     Head: Normocephalic and atraumatic.     Right Ear: Hearing normal. There is impacted cerumen.     Left Ear: Decreased hearing noted. There is impacted cerumen.     Nose: Nose normal.     Mouth/Throat:     Mouth: Mucous membranes are moist.  Cardiovascular:     Rate and Rhythm: Normal rate and regular rhythm.     Heart sounds: Normal heart sounds. No murmur heard.    No gallop.  Pulmonary:     Effort: Pulmonary effort is normal. No respiratory distress.     Breath sounds: Normal breath sounds. No wheezing, rhonchi or rales.  Skin:    General: Skin is  warm and dry.     Findings: Rash present. Rash is scaling.     Comments: Rash with whitish scale in plaques in web spaces b/t fingers.  Neurological:     Mental Status: He is alert and oriented to person, place, and time.     Results for orders placed or performed in visit on 07/09/22  POC COVID-19  Result Value Ref Range   SARS Coronavirus 2 Ag Negative Negative      Assessment & Plan:  Gastroenteritis -     CBC with Differential/Platelet -     Comprehensive metabolic panel  Diarrhea, unspecified type -     POC COVID-19 BinaxNow -     Cdiff NAA+O+P+Stool Culture -     CBC with Differential/Platelet -     Comprehensive metabolic panel  Nausea and vomiting, unspecified vomiting type -     POC COVID-19 BinaxNow -     Ondansetron HCl; Take 1 tablet (4 mg total) by mouth every 8 (eight) hours as needed for nausea or vomiting.  Dispense: 20 tablet; Refill: 0 -     CBC with Differential/Platelet -  Comprehensive metabolic panel  Other fatigue -Likely 2/2 fluid losses -     POC COVID-19 BinaxNow -     CBC with Differential/Platelet -     Comprehensive metabolic panel -     TSH  Bilateral impacted cerumen -Consent obtained.  Bilateral ears irrigated.  Patient tolerated procedure well. -Debrox eardrops as needed for continued symptoms  Psoriasis -     Triamcinolone Acetonide; Apply 1 Application topically 2 (two) times daily for 7 days.  Dispense: 30 g; Refill: 0  Symptoms likely 2/2 viral gastroenteritis versus infectious etiology.  COVID testing negative.  Discussed obtaining stool culture.  Patient unable to give sample while in clinic.  Consider abx after sample provided for culture.  OTC Imodium if needed. discussed the importance of hydration and hand hygiene. given strict precautions..  Return if symptoms worsen or fail to improve.   Deeann Saint, MD

## 2022-07-10 ENCOUNTER — Other Ambulatory Visit: Payer: Managed Care, Other (non HMO)

## 2022-07-10 DIAGNOSIS — K529 Noninfective gastroenteritis and colitis, unspecified: Secondary | ICD-10-CM

## 2022-07-10 DIAGNOSIS — R197 Diarrhea, unspecified: Secondary | ICD-10-CM

## 2022-07-10 LAB — COMPREHENSIVE METABOLIC PANEL WITH GFR
ALT: 22 U/L (ref 0–53)
AST: 19 U/L (ref 0–37)
Albumin: 4.2 g/dL (ref 3.5–5.2)
Alkaline Phosphatase: 52 U/L (ref 39–117)
BUN: 13 mg/dL (ref 6–23)
CO2: 23 meq/L (ref 19–32)
Calcium: 9.5 mg/dL (ref 8.4–10.5)
Chloride: 105 meq/L (ref 96–112)
Creatinine, Ser: 1.04 mg/dL (ref 0.40–1.50)
GFR: 97.14 mL/min
Glucose, Bld: 93 mg/dL (ref 70–99)
Potassium: 4 meq/L (ref 3.5–5.1)
Sodium: 137 meq/L (ref 135–145)
Total Bilirubin: 0.4 mg/dL (ref 0.2–1.2)
Total Protein: 7.2 g/dL (ref 6.0–8.3)

## 2022-07-10 LAB — CBC WITH DIFFERENTIAL/PLATELET
Basophils Absolute: 0.1 10*3/uL (ref 0.0–0.1)
Basophils Relative: 1.1 % (ref 0.0–3.0)
Eosinophils Absolute: 0.2 10*3/uL (ref 0.0–0.7)
Eosinophils Relative: 3.2 % (ref 0.0–5.0)
HCT: 42.3 % (ref 39.0–52.0)
Hemoglobin: 14.6 g/dL (ref 13.0–17.0)
Lymphocytes Relative: 35.3 % (ref 12.0–46.0)
Lymphs Abs: 1.8 10*3/uL (ref 0.7–4.0)
MCHC: 34.4 g/dL (ref 30.0–36.0)
MCV: 83.1 fl (ref 78.0–100.0)
Monocytes Absolute: 0.9 10*3/uL (ref 0.1–1.0)
Monocytes Relative: 17.7 % — ABNORMAL HIGH (ref 3.0–12.0)
Neutro Abs: 2.2 10*3/uL (ref 1.4–7.7)
Neutrophils Relative %: 42.7 % — ABNORMAL LOW (ref 43.0–77.0)
Platelets: 206 10*3/uL (ref 150.0–400.0)
RBC: 5.09 Mil/uL (ref 4.22–5.81)
RDW: 13.2 % (ref 11.5–15.5)
WBC: 5.2 10*3/uL (ref 4.0–10.5)

## 2022-07-10 LAB — TSH: TSH: 1.06 u[IU]/mL (ref 0.35–5.50)

## 2022-07-14 LAB — STOOL CULTURE: E coli, Shiga toxin Assay: NEGATIVE

## 2022-07-17 ENCOUNTER — Encounter: Payer: Self-pay | Admitting: Family Medicine

## 2022-08-16 ENCOUNTER — Other Ambulatory Visit: Payer: Self-pay | Admitting: Family Medicine

## 2022-08-16 DIAGNOSIS — R197 Diarrhea, unspecified: Secondary | ICD-10-CM

## 2022-08-16 DIAGNOSIS — K2970 Gastritis, unspecified, without bleeding: Secondary | ICD-10-CM

## 2022-08-16 DIAGNOSIS — R140 Abdominal distension (gaseous): Secondary | ICD-10-CM

## 2022-08-21 ENCOUNTER — Encounter: Payer: Self-pay | Admitting: Family Medicine

## 2022-08-25 ENCOUNTER — Encounter (HOSPITAL_BASED_OUTPATIENT_CLINIC_OR_DEPARTMENT_OTHER): Payer: Self-pay | Admitting: Emergency Medicine

## 2022-08-25 ENCOUNTER — Other Ambulatory Visit: Payer: Self-pay

## 2022-08-25 ENCOUNTER — Emergency Department (HOSPITAL_BASED_OUTPATIENT_CLINIC_OR_DEPARTMENT_OTHER)
Admission: EM | Admit: 2022-08-25 | Discharge: 2022-08-25 | Disposition: A | Discharge status: Home / Self Care | Payer: Managed Care, Other (non HMO) | Attending: Emergency Medicine | Admitting: Emergency Medicine

## 2022-08-25 ENCOUNTER — Emergency Department (HOSPITAL_BASED_OUTPATIENT_CLINIC_OR_DEPARTMENT_OTHER): Payer: Managed Care, Other (non HMO) | Admitting: Radiology

## 2022-08-25 DIAGNOSIS — S8992XA Unspecified injury of left lower leg, initial encounter: Secondary | ICD-10-CM | POA: Diagnosis present

## 2022-08-25 DIAGNOSIS — X501XXA Overexertion from prolonged static or awkward postures, initial encounter: Secondary | ICD-10-CM | POA: Insufficient documentation

## 2022-08-25 DIAGNOSIS — Y9372 Activity, wrestling: Secondary | ICD-10-CM | POA: Insufficient documentation

## 2022-08-25 NOTE — ED Provider Notes (Signed)
[place] EMERGENCY DEPARTMENT AT [facility] Provider Note   CSN: [id number] Arrival date & time: 08/25/22  1206     History  Chief Complaint  Patient presents with   Knee Injury    Luis Lewis is a 32 y.o. male presenting today with concern for left knee injury.  He reports that he does jujitsu and his knee gave out while his leg was twisted around somebody's chest wall.  There is a person at the studio with a kinesiology degree who reported potential ACL/MCL injury.  Patient is unable to bear weight.  No numbness or tingling.  HPI     Home Medications Prior to Admission medications   Medication Sig Start Date End Date Taking? Authorizing Provider  ondansetron (ZOFRAN) 4 MG tablet Take 1 tablet (4 mg total) by mouth every 8 (eight) hours as needed for nausea or vomiting. 07/09/22   Deeann Saint, MD      Allergies    Sulfa antibiotics and Clotrimazole    Review of Systems   Review of Systems  Physical Exam Updated Vital Signs BP (!) 160/98 (BP Location: Right Arm)   Pulse (!) 101   Temp 98.4 F (36.9 C) (Oral)   Resp 18   SpO2 98%  Physical Exam Vitals and nursing note reviewed.  Constitutional:      Appearance: Normal appearance.  HENT:     Head: Normocephalic and atraumatic.  Eyes:     General: No scleral icterus.    Conjunctiva/sclera: Conjunctivae normal.  Pulmonary:     Effort: Pulmonary effort is normal. No respiratory distress.  Musculoskeletal:     Comments: Full range of motion of the knee.  Strong DP pulse and no tenderness to palpation of the knee itself.  No laxity on ligamental testing however does have some pain with valgus test  Skin:    Findings: No rash.  Neurological:     Mental Status: He is alert.  Psychiatric:        Mood and Affect: Mood normal.     ED Results / Procedures / Treatments   Labs (all labs ordered are listed, but only abnormal results are displayed) Labs Reviewed - No data to  display  EKG None  Radiology No results found.  Procedures Procedures   Medications Ordered in ED Medications - No data to display  ED Course/ Medical Decision Making/ A&P                             Medical Decision Making Amount and/or Complexity of Data Reviewed Radiology: ordered.  32 year old male presented today with left knee injury.  Differential includes but is not limited to fracture, dislocation, ligamental injury, septic joint and DVT. This is not exhaustive  X-ray ordered, viewed and interpreted by me.  I agree with radiology  Treatment: Knee immobilizer and crutches, declined medication  Disposition: 32 year old male presenting today with a knee injury.  Reports that his knee gave out and jujitsu.  His x-ray is normal and we do not have an MRI.  He is neurovascularly intact and I do not believe he needs to be transferred for emergent MRI.  He will be discharged home with knee able localizer, crutches and follow-up with orthopedics.  He is agreeable.  Final Clinical Impression(s) / ED Diagnoses Final diagnoses:  Injury of left knee, initial encounter    Rx / DC Orders ED Discharge Orders     None  Results and diagnoses were explained to the patient. Return precautions discussed in full. Patient had no additional questions and expressed complete understanding.   This chart was dictated using voice recognition software.  Despite best efforts to proofread,  errors can occur which can change the documentation meaning.    Woodroe Chen 08/25/22 1345    Glynn Octave, MD 08/25/22 [phone]

## 2022-08-25 NOTE — ED Triage Notes (Signed)
Pt presents to ED POV. Pt c/o pain to L knee. Pt reports he was wrestling and felt like his tendon rolled from next to his knee cap on the L to further to left. Pt reports excruciating pain after.

## 2022-08-25 NOTE — Discharge Instructions (Addendum)
You came to the emergency room today after knee injury.  Your x-ray is normal.  As we discussed, we will not see ligamental injuries on the x-ray and this is usually diagnosed via MRI.  I have attached Dr. Shon Baton to these discharge papers.  You may call on Monday to get an appointment.  In the meantime, alternate 600 mg ibuprofen with 500 mg Tylenol.  You may alternate these every 3 hours.  Additionally, try and stay off of your knee, elevate and ice the joint.  Do not hesitate to return to the emergency department with any worsening symptoms.  It was a pleasure to meet you and we hope you feel better!

## 2022-08-25 NOTE — ED Notes (Signed)
Pt notified ED tech that he does not want crutches or knee imobolizer

## 2023-12-02 ENCOUNTER — Encounter: Payer: Self-pay | Admitting: Family Medicine

## 2023-12-02 ENCOUNTER — Ambulatory Visit (INDEPENDENT_AMBULATORY_CARE_PROVIDER_SITE_OTHER): Admitting: Family Medicine

## 2023-12-02 VITALS — BP 131/81 | HR 80 | Temp 98.8°F | Ht 67.0 in | Wt 211.0 lb

## 2023-12-02 DIAGNOSIS — Z6833 Body mass index (BMI) 33.0-33.9, adult: Secondary | ICD-10-CM

## 2023-12-02 DIAGNOSIS — Z0000 Encounter for general adult medical examination without abnormal findings: Secondary | ICD-10-CM | POA: Diagnosis not present

## 2023-12-02 NOTE — Progress Notes (Signed)
 Established Patient Office Visit   Subjective  Patient ID: Luis Lewis, male    DOB: April 01, 1991  Age: 33 y.o. MRN: [id number]  Chief Complaint  Patient presents with   Annual Exam    Pt is a 33 yo male seen for CPE.  PT doing well overall.  Had surgery on L knee to repair a bucket handle mensicus tear that occurred during Jujitsu.  Notes weight gain during the recovery.  Thinks weight gain contributing to elevation in bp.  BP typically in the 120s/upper 60s.  Pt has from for completion to participate in foster care.    There are no active problems to display for this patient.  Past Medical History:  Diagnosis Date   Chicken pox    Hypertension    History reviewed. No pertinent surgical history. Social History   Tobacco Use   Smoking status: Never   Smokeless tobacco: Never  Substance Use Topics   Alcohol use: Yes    Comment: occassional   Drug use: No   History reviewed. No pertinent family history. Allergies  Allergen Reactions   Sulfa Antibiotics     Rash as an infant   Clotrimazole Rash    ROS Negative unless stated above    Objective:     BP 131/81 (BP Location: Right Arm, Patient Position: Sitting, Cuff Size: Large)   Pulse 80   Temp 98.8 F (37.1 C) (Oral)   Ht 5' 7 (1.702 m)   Wt 211 lb (95.7 kg)   SpO2 98%   BMI 33.05 kg/m  BP Readings from Last 3 Encounters:  12/02/23 131/81  07/09/22 126/68  02/09/22 124/82   Wt Readings from Last 3 Encounters:  12/02/23 211 lb (95.7 kg)  07/09/22 187 lb 9.6 oz (85.1 kg)  02/09/22 191 lb 9.6 oz (86.9 kg)      Physical Exam Constitutional:      Appearance: Normal appearance.  HENT:     Head: Normocephalic and atraumatic.     Right Ear: Tympanic membrane, ear canal and external ear normal.     Left Ear: Tympanic membrane, ear canal and external ear normal.     Nose: Nose normal.     Mouth/Throat:     Mouth: Mucous membranes are moist.     Pharynx: No oropharyngeal exudate or posterior  oropharyngeal erythema.  Eyes:     General: No scleral icterus.    Extraocular Movements: Extraocular movements intact.     Conjunctiva/sclera: Conjunctivae normal.     Pupils: Pupils are equal, round, and reactive to light.  Neck:     Thyroid: No thyromegaly.  Cardiovascular:     Rate and Rhythm: Normal rate and regular rhythm.     Pulses: Normal pulses.     Heart sounds: Normal heart sounds. No murmur heard.    No friction rub.  Pulmonary:     Effort: Pulmonary effort is normal.     Breath sounds: Normal breath sounds. No wheezing, rhonchi or rales.  Abdominal:     General: Bowel sounds are normal.     Palpations: Abdomen is soft.     Tenderness: There is no abdominal tenderness.  Musculoskeletal:        General: No deformity. Normal range of motion.  Lymphadenopathy:     Cervical: No cervical adenopathy.  Skin:    General: Skin is warm and dry.     Findings: No lesion.  Neurological:     General: No focal deficit present.  Mental Status: He is alert and oriented to person, place, and time.  Psychiatric:        Mood and Affect: Mood normal.        Thought Content: Thought content normal.        12/02/2023    3:04 PM 07/09/2022    2:29 PM 05/12/2021    8:36 AM  Depression screen PHQ 2/9  Decreased Interest 0 3 1  Down, Depressed, Hopeless 0 0 0  PHQ - 2 Score 0 3 1  Altered sleeping 0 0 1  Tired, decreased energy 0 3 2  Change in appetite 2 3 0  Feeling bad or failure about yourself  1 0 1  Trouble concentrating 0 2 1  Moving slowly or fidgety/restless 0 0 0  Suicidal thoughts 0 0 0  PHQ-9 Score 3 11 6   Difficult doing work/chores Not difficult at all Somewhat difficult       12/02/2023    3:04 PM  GAD 7 : Generalized Anxiety Score  Nervous, Anxious, on Edge 1  Control/stop worrying 0  Worry too much - different things 0  Trouble relaxing 1  Restless 0  Easily annoyed or irritable 1  Afraid - awful might happen 0  Total GAD 7 Score 3  Anxiety  Difficulty Not difficult at all     No results found for any visits on 12/02/23.    Assessment & Plan:   Well adult exam -     CBC with Differential/Platelet; Future -     Comprehensive metabolic panel with GFR; Future -     Hemoglobin A1c; Future -     Lipid panel; Future -     TSH; Future -     T4, free; Future  BMI 33.0-33.9,adult  Age appropriate health screenings discussed.  Obtain labs.  Immunizations reviewed.  Patient encouraged to increase physical activity and make changes to diet.  Form completed for foster care.  Return in about 1 year (around 12/01/2024), or if symptoms worsen or fail to improve, for physical.   Clotilda JONELLE Single, MD

## 2023-12-03 LAB — COMPREHENSIVE METABOLIC PANEL WITH GFR
ALT: 21 U/L (ref 0–53)
AST: 22 U/L (ref 0–37)
Albumin: 4.8 g/dL (ref 3.5–5.2)
Alkaline Phosphatase: 56 U/L (ref 39–117)
BUN: 15 mg/dL (ref 6–23)
CO2: 31 meq/L (ref 19–32)
Calcium: 9.7 mg/dL (ref 8.4–10.5)
Chloride: 101 meq/L (ref 96–112)
Creatinine, Ser: 1.26 mg/dL (ref 0.40–1.50)
GFR: 76.4 mL/min (ref >60.00)
Glucose, Bld: 82 mg/dL (ref 70–99)
Potassium: 3.8 meq/L (ref 3.5–5.1)
Sodium: 139 meq/L (ref 135–145)
Total Bilirubin: 0.5 mg/dL (ref 0.2–1.2)
Total Protein: 7.8 g/dL (ref 6.0–8.3)

## 2023-12-03 LAB — CBC WITH DIFFERENTIAL/PLATELET
Basophils Absolute: 0.1 K/uL (ref 0.0–0.1)
Basophils Relative: 1 % (ref 0.0–3.0)
Eosinophils Absolute: 0.2 K/uL (ref 0.0–0.7)
Eosinophils Relative: 3.4 % (ref 0.0–5.0)
HCT: 44.1 % (ref 39.0–52.0)
Hemoglobin: 14.8 g/dL (ref 13.0–17.0)
Lymphocytes Relative: 31.5 % (ref 12.0–46.0)
Lymphs Abs: 2.1 K/uL (ref 0.7–4.0)
MCHC: 33.5 g/dL (ref 30.0–36.0)
MCV: 84.4 fl (ref 78.0–100.0)
Monocytes Absolute: 0.9 K/uL (ref 0.1–1.0)
Monocytes Relative: 12.9 % — ABNORMAL HIGH (ref 3.0–12.0)
Neutro Abs: 3.5 K/uL (ref 1.4–7.7)
Neutrophils Relative %: 51.2 % (ref 43.0–77.0)
Platelets: 224 K/uL (ref 150.0–400.0)
RBC: 5.22 Mil/uL (ref 4.22–5.81)
RDW: 13.7 % (ref 11.5–15.5)
WBC: 6.7 K/uL (ref 4.0–10.5)

## 2023-12-03 LAB — LIPID PANEL
Cholesterol: 214 mg/dL — ABNORMAL HIGH (ref 0–200)
HDL: 34 mg/dL — ABNORMAL LOW (ref >39.00)
LDL Cholesterol: 138 mg/dL — ABNORMAL HIGH (ref 0–99)
NonHDL: 180.05
Total CHOL/HDL Ratio: 6
Triglycerides: 208 mg/dL — ABNORMAL HIGH (ref 0.0–149.0)
VLDL: 41.6 mg/dL — ABNORMAL HIGH (ref 0.0–40.0)

## 2023-12-03 LAB — HEMOGLOBIN A1C: Hgb A1c MFr Bld: 5.7 % (ref 4.6–6.5)

## 2023-12-03 LAB — T4, FREE: Free T4: 0.92 ng/dL (ref 0.60–1.60)

## 2023-12-03 LAB — TSH: TSH: 1.17 u[IU]/mL (ref 0.35–5.50)

## 2023-12-04 ENCOUNTER — Ambulatory Visit: Payer: Self-pay | Admitting: Family Medicine
